# Patient Record
Sex: Male | Born: 1995 | Hispanic: Refuse to answer | Marital: Single | Smoking: Never smoker
Health system: Southern US, Community
[De-identification: ages and names within clinical notes are randomized; demographics above are authoritative.]

## PROBLEM LIST (undated history)

## (undated) DIAGNOSIS — J45909 Unspecified asthma, uncomplicated: Secondary | ICD-10-CM

## (undated) HISTORY — DX: Unspecified asthma, uncomplicated: J45.909

---

## 2017-04-08 ENCOUNTER — Ambulatory Visit: Admit: 2017-04-08 | Discharge: 2017-04-09 | Payer: PRIVATE HEALTH INSURANCE

## 2017-09-27 ENCOUNTER — Ambulatory Visit: Admit: 2017-09-27 | Discharge: 2017-09-28 | Payer: PRIVATE HEALTH INSURANCE

## 2017-10-17 ENCOUNTER — Ambulatory Visit: Admit: 2017-10-17 | Discharge: 2017-10-17 | Payer: PRIVATE HEALTH INSURANCE

## 2017-10-17 ENCOUNTER — Ambulatory Visit: Admit: 2017-10-17 | Discharge: 2017-10-18 | Payer: PRIVATE HEALTH INSURANCE

## 2017-10-17 DIAGNOSIS — M84375A Stress fracture, left foot, initial encounter for fracture: Secondary | ICD-10-CM

## 2017-10-17 DIAGNOSIS — M79672 Pain in left foot: Secondary | ICD-10-CM

## 2017-10-17 NOTE — Patient Instructions (Addendum)
You have a stress reaction of your second metatarsal    You should rest your foot and wear a boot. If you have pain, you should not push through the pain because it will just prolong the recovery.    You will be in the boot until your pain resolves, possibly 6-8 weeks.     Avoid taking anti-inflammatories like aleve, ibuprofen, and motrin because they can decrease bone healing    You should take a:  -multivitamin daily  -Vitamin D - extra 2000U daily  -Calcium - 1000mg  daily    The hardest thing for your bones is landing on your feet after jumping  As you come back, the hardest thing is avoiding over doing it  You will also need a shoe with a carbon fiber insert (to avoid bending in that area) or a stiff-soled shoe    We will write a prescription for a bone stimulator

## 2017-10-17 NOTE — Progress Notes (Signed)
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 Patient does recall a specific traumatic incident -- at the beginning of the season in 2017 - 05/2016 he started having midfoot pain and was diagnosed with a stress reaction of his 2nd metatarsal. Onset: 2.5 years, worse with exercise, especially cutting/pivoting exercises, and is relieved with rest. Pain quality: aching and sharp.  Previous treatments have included a cam boot, bone stimulator, physical therapy.    He was originally treated in a cam boot for 4 weeks and then was out the 2017-18 season redshirting. His pain returned in early 2018. He has not really rested since. He played through the pain this whole season. He has one more year left of eligibility to play basketball at [employer].     Pain is rated 3/10 (0=no pain, 10=worst imaginable pain).     For activities, the patient enjoys basketball.    PAST MEDICAL HISTORY:    Past Medical History:   Diagnosis Date    Asthma     Fractures          PAST SURGICAL HISTORY:    History reviewed. No pertinent surgical history.    ALLERGIES:    Patient has no known allergies.    MEDICATIONS:    No current outpatient medications on file.     No current facility-administered medications for this visit.        SOCIAL HISTORY:  Occupation:      Marital Status: Single   Tobacco Use: The patient  reports that he has never smoked. He has never used smokeless tobacco.     FAMILY HISTORY:    History reviewed. No pertinent family history.    REVIEW OF SYSTEMS:    ROS  Constitutional: Negative.    HENT: Negative.    Eyes: Negative.    Respiratory: Negative.    Cardiovascular: Negative.    Gastrointestinal: Negative.    Genitourinary: Negative.    Skin: Negative.     Neurological: Negative.    Endo/Heme/Allergies: Negative.    Psychiatric/Behavioral: Negative.      All other systems were reviewed and are negative.     Objective:      PHYSICAL EXAM:    Ht 195.6 cm (6\' 5" )   Wt 79.4 kg (175 lb)   BMI 20.75 kg/m     Physical Exam   Constitutional: Oriented to person, place, and time. Appears well-developed and well-nourished.   HENT:   Head: Normocephalic and atraumatic.   Eyes: Conjunctivae are normal.   Cardiovascular: Normal rate and regular rhythm.    Pulmonary/Chest: Effort normal.   Neurological: Alert and oriented to person, place, and time.   Skin: Skin is warm and dry.   Psychiatric: Normal mood and affect. Behavior is normal. Thought content normal.     ORTHOPAEDIC EXAM:    Right Ankle Exam     Tenderness   Right ankle tenderness location: over base of 2nd metatarsal.  Swelling: none    Range of Motion   The patient has normal right ankle ROM.    Muscle Strength   The patient has normal right ankle strength.    Other   Erythema: absent  Scars: absent  Sensation: normal  Pulse: present              IMAGING:  Based on the patients history and physical exam, radiographs were obtained.  I have personally reviewed and interpreted the images today.     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    Diagnosis, treatment options and long term prognosis were discussed.  Non-operative conservative measures as well as surgical intervention were both addressed.     The patient was offered the following conservative, non-operative measures: inserts/orthotics, ice, immobilization in a CAM boot walker and activity modification. They will consider their options and contact  us accordingly.     The patient was educated extensively about their condition, including the pathology, etiology, natural history and treatment options. The patient voiced understanding and all questions were answered.    Plan:  -Immobilization in cam boot  -rest, ice, elevation  -multivitamin, vitamin D and calcium supplements  -discussed bone stimulator-however pt previously had a bone stimulator   -referral placed to skeletal health          I personally reviewed and interpreted imaging with the patient.       The patient will follow-up: Return if symptoms worsen or fail to improve.    On arrival patient will need: 3V foot WB         Report electronically signed by:    Freddie Breech, MD  Foot [facility]le [facility], [place]      10/18/2017    ATTESTATION:  My date of service is 10/17/2017.  I was present for and performed key portions of an examination of the patient.  I am personally involved in the management of the patient. I agree with the findings and care plans as documented.    The above scribed documentation as annotated by me accurately reflects the services I have provided.    Freddie Breech, MD  10/18/2017 7:21 AM

## 2017-10-20 NOTE — Telephone Encounter (Signed)
[employer] [facility] calls to clarify a few things from patient's appointment.  They state pt has history of bilateral rigidity and they are concerned about him being immobilized for 8 weeks given he is an athlete. They would like to know what activities and modalities the patient is clear to do outside his boot such as a stationary bike and rowing machine.They have several alternative therapies such as Clash 4 laser therapy/light cure therapy and a VFR for restricted blood flow.     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 They would like to know exactly what the patient can do so that they can maintain his mobility and strength he has already gained.     The team physician, Dr. Vanita Ingles, is out of the country this week but can be reached next week for further questions at [phone] or [phone]

## 2017-10-25 NOTE — Telephone Encounter (Signed)
LVM for Steven Bullock explaining that per Dr. Constance Goltz patient can try therapies such as clash 4 laser/light cure therapy and VFR. He should only do them as tolerated and avoid any activities that cause pain or tenderness. Patient can also do activities such as swimming, jogging, weight lifting. Upper extremity bicycle, stationary bicycle alter G machine and rowing. Patient should avoid pivoting and jumping movements at first

## 2017-11-28 ENCOUNTER — Ambulatory Visit: Admit: 2017-11-28 | Discharge: 2017-11-29 | Payer: PRIVATE HEALTH INSURANCE

## 2017-11-28 ENCOUNTER — Ambulatory Visit: Admit: 2017-11-28 | Discharge: 2017-11-28 | Payer: PRIVATE HEALTH INSURANCE

## 2017-11-28 DIAGNOSIS — M79672 Pain in left foot: Secondary | ICD-10-CM

## 2017-11-28 DIAGNOSIS — M84375D Stress fracture, left foot, subsequent encounter for fracture with routine healing: Secondary | ICD-10-CM

## 2017-11-28 MED ORDER — CHOLECALCIFEROL (VITAMIN D3) 25 MCG (1,000 UNIT) CAPSULE
1000 | ORAL | Status: AC
Start: 2017-11-28 — End: ?

## 2017-11-28 MED ORDER — DIPHENHYDRAMINE 25 MG TABLET
25 | ORAL | Status: AC
Start: 2017-11-28 — End: 2018-11-05

## 2017-11-28 NOTE — Patient Instructions (Addendum)
Plan:  - You may gradually transition out of the CAM boot, guided by pain and tenderness  - If you plan on increased activity then plan on wearing the CAM boot  - Return to basketball gradually and in moderation with the assistance of physical therapy guided by pain and tenderness  - Take multivitamins, vitamin D supplements (2000 IU daily to aid bone healing), and calcium through food.  - Please use the anti-inflammatory oral medications or topical creams/gels (Voltaren).   - Transition back into stiff soled shoes with good arch support and cushioning  - Rest, ice, and elevation as needed  - Follow up in [place]- [place]

## 2017-11-28 NOTE — Progress Notes (Addendum)
Subjective:      I had the pleasure of seeing Steven Bullock in the [facility] today for evaluation of Follow-up (left foot )     HPI  Steven Bullock is a 22 y.o. 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     Activity: WB in CAM boot  Overall, patient is improving and continues to be immobilized in his CAM boot. However, patient states that he continues to experience "cracking" when WB  out of his boot such as when he is in the shower. He is currently using a bone stimulator.     Pain is rated 1/10 (0=no pain, 10=worst imaginable pain).     Patient does not complain of numbness of the ankle or foot.  Patient does not complain of instability of the foot/ ankle.  Patient is not taking narcotic pain medicine.  Patient is not taking anti-inflammatories.    Medication Taken: None    PAST MEDICAL HISTORY:    Past Medical History:   Diagnosis Date    Asthma     Fractures          PAST SURGICAL HISTORY:    No past surgical history on file.    ALLERGIES:    Patient has no known allergies.    MEDICATIONS:    Current Outpatient Medications   Medication Sig Dispense Refill    cholecalciferol, vitamin D3, 1,000 unit CAP Take 2,000 Units by mouth.      diphenhydrAMINE (BENADRYL) 25 mg tablet Take 25 mg by mouth.       No current facility-administered medications for this visit.        SOCIAL HISTORY:  Occupation:      Marital Status: Single   Tobacco Use: The patient  reports that he has never smoked. He has never used smokeless tobacco.     FAMILY HISTORY:    No family history on file.    REVIEW OF SYSTEMS:    ROS  Constitutional: Negative.    HENT: Negative.    Eyes: Negative.    Respiratory: Negative.    Cardiovascular: Negative.    Gastrointestinal: Negative.    Genitourinary: Negative.    Skin: Negative.    Neurological: Negative.    Endo/Heme/Allergies: Negative.    Psychiatric/Behavioral:  Negative.      All other systems were reviewed and are negative.     Objective:      PHYSICAL EXAM:    There were no vitals taken for this visit.    Physical Exam   Constitutional: Oriented to person, place, and time. Appears well-developed and well-nourished.   HENT:   Head: Normocephalic and atraumatic.   Eyes: Conjunctivae are normal.   Cardiovascular: Normal rate and regular rhythm.    Pulmonary/Chest: Effort normal.   Neurological: Alert and oriented to person, place, and time.   Skin: Skin is warm and dry.   Psychiatric: Normal mood and affect. Behavior is normal. Thought content normal.     ORTHOPAEDIC EXAM:    Right Ankle Exam     Tenderness   Right ankle tenderness location: over base of 2nd metatarsal.  Swelling: none    Range of Motion   The patient has normal right ankle ROM.    Muscle Strength   The patient has normal right ankle strength.    Other   Erythema: absent  Scars: absent  Sensation: normal  Pulse:  present              IMAGING:  Based on the patients history and physical exam, radiographs were obtained.  I have personally reviewed and interpreted the images today.     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     Activity: WB in CAM boot  Overall, patient is improving and continues to be immobilized in his CAM boot. However, patient states that he continues to experience "cracking" when WB  out of his boot such as when he is in the shower. He is currently using a bone stimulator.     Diagnosis, treatment options and long term prognosis were discussed.  Non-operative conservative measures as well as surgical intervention were both addressed.     Radiographs were reviewed with the patient today which shows no  evidence of new fractures or significant concerns.     He educated Steven Bullock that he has decided to transfer schools and is going back to [place] to spend the next year playing Basketball.  He is leaving in a few weeks. We told him that he can follow up with our colleagues in [place] if needed as well.    Patient was educated regarding diagnosis and treatment options.  Imaging was reviewed with the patient.  All questions were answered to verbal satisfaction.    Plan:  - You may gradually transition out of the CAM boot, guided by pain and tenderness  - If you plan on increased activity then plan on wearing the CAM boot  - Return to basketball gradually and in moderation with the assistance of physical therapy guided by pain and tenderness  - Take multivitamins, vitamin D supplements (2000 IU daily to aid bone healing), and calcium through food.  - Please use the anti-inflammatory oral medications or topical creams/gels (Voltaren).   - Transition back into stiff soled shoes with good arch support and cushioning  - Rest, ice, and elevation as needed  - Follow up in [place]     I personally reviewed and interpreted imaging with the patient.      30 minutes ([id number]) were spent with the patient, 25 minutes of which was spent providing education, guidance and counseling.     The patient will follow-up: Return if symptoms worsen or fail to improve, for f/u with Dr. Constance Goltz.  On arrival patient will need: 3V foot WB       Report electronically signed by:    Freddie Breech, MD  Foot & Ankle Othopaedic Surgery, [place]      I, Rosalie Gums am acting as a scribe for services provided by Freddie Breech, MD on 11/28/2017 3:53 PM  The above scribed documentation accurately reflects the services I have provided.    Freddie Breech, MD   11/29/2017 7:45 AM

## 2018-08-21 ENCOUNTER — Ambulatory Visit (INDEPENDENT_AMBULATORY_CARE_PROVIDER_SITE_OTHER): Payer: Self-pay | Admitting: Family Medicine

## 2018-08-21 ENCOUNTER — Encounter: Payer: Self-pay | Admitting: Family Medicine

## 2018-08-21 VITALS — BP 115/67 | HR 67 | Temp 98.2°F | Resp 14

## 2018-08-21 DIAGNOSIS — Z113 Encounter for screening for infections with a predominantly sexual mode of transmission: Secondary | ICD-10-CM

## 2018-08-21 MED ORDER — ALBUTEROL SULFATE HFA 108 (90 BASE) MCG/ACT IN AERS: 2.0000 | INHALATION_SPRAY | RESPIRATORY_TRACT | 2 refills | Status: DC | PRN

## 2018-08-21 NOTE — Progress Notes (Signed)
Patient presents today for STD screening.  Patient denies any symptoms at this time.  He denies any history of STDs in the past.  He admits that he is sexually active with women only.  He denies any genital rash, dysuria, joint pain, eye symptoms, penile discharge.  He denies any of his partners having symptoms.  He requests additional STD testing for HIV and RPR when asked.  Patient also requests refill on his albuterol inhaler.  He denies any problems currently with shortness of breath, chest pain, wheezing, cough.  Patient denies having unprotected sex.  ROS: Negative except mentioned above. Vitals as per Epic. GENERAL: NAD HEENT: no pharyngeal erythema, no exudate CARD: RRR RESP: CTA bilateral GU: deferred NEURO: CN II-XII grossly intact   A/P: STD screening - asymptomatic, will draw GC/Chlamydia (urine), HIV and RPR, seek medical attention if any symptoms, would refrain from sexual activity until results have been discussed, safe sex practices encouraged always.  Asthma - will refill patient's Albuterol Inhaler, encourage patient to keep it with him at all times.

## 2018-08-22 LAB — SPECIMEN STATUS

## 2018-08-23 LAB — CHLAMYDIA/GONOCOCCUS/TRICHOMONAS, NAA
Chlamydia by NAA: NEGATIVE
Gonococcus by NAA: NEGATIVE
Trich vag by NAA: NEGATIVE

## 2018-08-23 LAB — RPR: RPR Ser Ql: NONREACTIVE

## 2018-08-23 LAB — HIV ANTIBODY (ROUTINE TESTING W REFLEX): HIV Screen 4th Generation wRfx: NONREACTIVE

## 2019-03-26 ENCOUNTER — Other Ambulatory Visit: Payer: Self-pay

## 2019-03-26 DIAGNOSIS — Z20822 Contact with and (suspected) exposure to covid-19: Secondary | ICD-10-CM

## 2019-03-27 LAB — NOVEL CORONAVIRUS, NAA: SARS-CoV-2, NAA: NOT DETECTED

## 2019-08-28 DIAGNOSIS — R0789 Other chest pain: Secondary | ICD-10-CM | POA: Diagnosis present

## 2019-08-28 DIAGNOSIS — J45901 Unspecified asthma with (acute) exacerbation: Secondary | ICD-10-CM | POA: Diagnosis not present

## 2019-08-28 NOTE — ED Triage Notes (Signed)
FIRST NURSE NOTE: Pt reports he is out of his inhaler and has hx of asthma.

## 2019-08-29 ENCOUNTER — Other Ambulatory Visit: Payer: Self-pay

## 2019-08-29 ENCOUNTER — Emergency Department
Admission: EM | Admit: 2019-08-29 | Discharge: 2019-08-29 | Disposition: A | Discharge status: Home / Self Care | Payer: 59 | Attending: Emergency Medicine | Admitting: Emergency Medicine

## 2019-08-29 ENCOUNTER — Emergency Department: Payer: 59

## 2019-08-29 DIAGNOSIS — J45901 Unspecified asthma with (acute) exacerbation: Secondary | ICD-10-CM

## 2019-08-29 MED ORDER — ALBUTEROL SULFATE HFA 108 (90 BASE) MCG/ACT IN AERS: 2.0000 | INHALATION_SPRAY | RESPIRATORY_TRACT | 1 refills | Status: AC | PRN

## 2019-08-29 MED ORDER — IPRATROPIUM-ALBUTEROL 0.5-2.5 (3) MG/3ML IN SOLN
RESPIRATORY_TRACT | Status: AC
  Filled 2019-08-29: qty 3

## 2019-08-29 NOTE — ED Triage Notes (Signed)
Pt to the er for asthma flare up. Pt states it doesn't usually bother him and the last time it flared was a year ago. Pt denies any strenuous activity. Pt reports tightness to chest but denies pain. O2 sats WNL.

## 2019-08-29 NOTE — ED Provider Notes (Signed)
[facility] Emergency Department Provider Note   ____________________________________________    I have reviewed the triage vital signs and the nursing notes.   HISTORY  Chief Complaint Chest tightness    HPI Bryan Keith is a 24 y.o. male with a history of asthma who reports that he has had a tightness in his chest with mild shortness of breath which started today.  He does not have an asthma inhaler so he came to the emergency department.  Denies fevers or chills.  No cough.  No myalgias or fatigue.  Is in [employer] student  Past Medical History:  Diagnosis Date  . Asthma     There are no problems to display for this patient.   History reviewed. No pertinent surgical history.  Prior to Admission medications   Medication Sig Start Date End Date Taking? Authorizing Provider  albuterol (VENTOLIN HFA) 108 (90 Base) MCG/ACT inhaler Inhale 2 puffs into the lungs every 4 (four) hours as needed for wheezing or shortness of breath. 08/29/19   Lavonia Drafts, MD     Allergies Patient has no known allergies.  Family History  Problem Relation Age of Onset  . Cancer Father   . Cancer Maternal Grandfather     Social History Social History   Tobacco Use  . Smoking status: Never Smoker  . Smokeless tobacco: Never Used  Substance Use Topics  . Alcohol use: Yes  . Drug use: Not on file    Review of Systems  Constitutional: No fever/chills  ENT: No sore throat. Respiratory: As above CV: As above   Musculoskeletal: Negative for back pain. Skin: Negative for rash. Neurological: Negative for headaches     ____________________________________________   PHYSICAL EXAM:  VITAL SIGNS: ED Triage Vitals  Enc Vitals Group     BP 08/29/19 0009 140/88     Pulse Rate 08/29/19 0009 83     Resp 08/29/19 0009 20     Temp 08/29/19 0009 98.4 F (36.9 C)     Temp Source 08/29/19 0232 Oral     SpO2 08/29/19 0009 96 %     Weight 08/29/19 0007  79.4 kg (175 lb)     Height 08/29/19 0007 1.956 m (6\' 5" )     Head Circumference --      Peak Flow --      Pain Score 08/29/19 0010 4     Pain Loc --      Pain Edu? --      Excl. in [place]? --     Constitutional: Alert and oriented. No acute distress. Pleasant and interactive  Nose: No congestion/rhinnorhea. Mouth/Throat: Mucous membranes are moist.   Cardiovascular: Normal rate, regular rhythm.  Respiratory: Normal respiratory effort.  No retractions.,  Scattered very mild wheezes Genitourinary: deferred Musculoskeletal: No lower extremity tenderness nor edema.   Neurologic:  Normal speech and language. No gross focal neurologic deficits are appreciated.   Skin:  Skin is warm, dry and intact. No rash noted.   ____________________________________________   LABS (all labs ordered are listed, but only abnormal results are displayed)  Labs Reviewed - No data to display ____________________________________________  EKG   ____________________________________________  RADIOLOGY   ____________________________________________   PROCEDURES  Procedure(s) performed: No  Procedures   Critical Care performed: No ____________________________________________   INITIAL IMPRESSION / ASSESSMENT AND PLAN / ED COURSE  Pertinent labs & imaging results that were available during my care of the patient were reviewed by me and considered in my medical  decision making (see chart for details).  Patient well-appearing and in no acute distress, treated with DuoNeb with resolution of chest tightness and resolution of wheezes on exam.  Have prescribed albuterol inhaler, return precautions discussed   ____________________________________________   FINAL CLINICAL IMPRESSION(S) / ED DIAGNOSES  Final diagnoses:  Exacerbation of asthma, unspecified asthma severity, unspecified whether persistent      NEW MEDICATIONS STARTED DURING THIS VISIT:  Discharge Medication List as of  08/29/2019  2:24 AM       Note:  This document was prepared using Dragon voice recognition software and may include unintentional dictation errors.   Jene Every, MD 08/29/19 [phone]

## 2021-02-07 IMAGING — CR DG CHEST 2V
2 series · 2 of 2 positions shown · non-contrast
Comparison: None.

CLINICAL DATA: Asthma flare up.

EXAM:
CHEST - 2 VIEW

[chest pa]
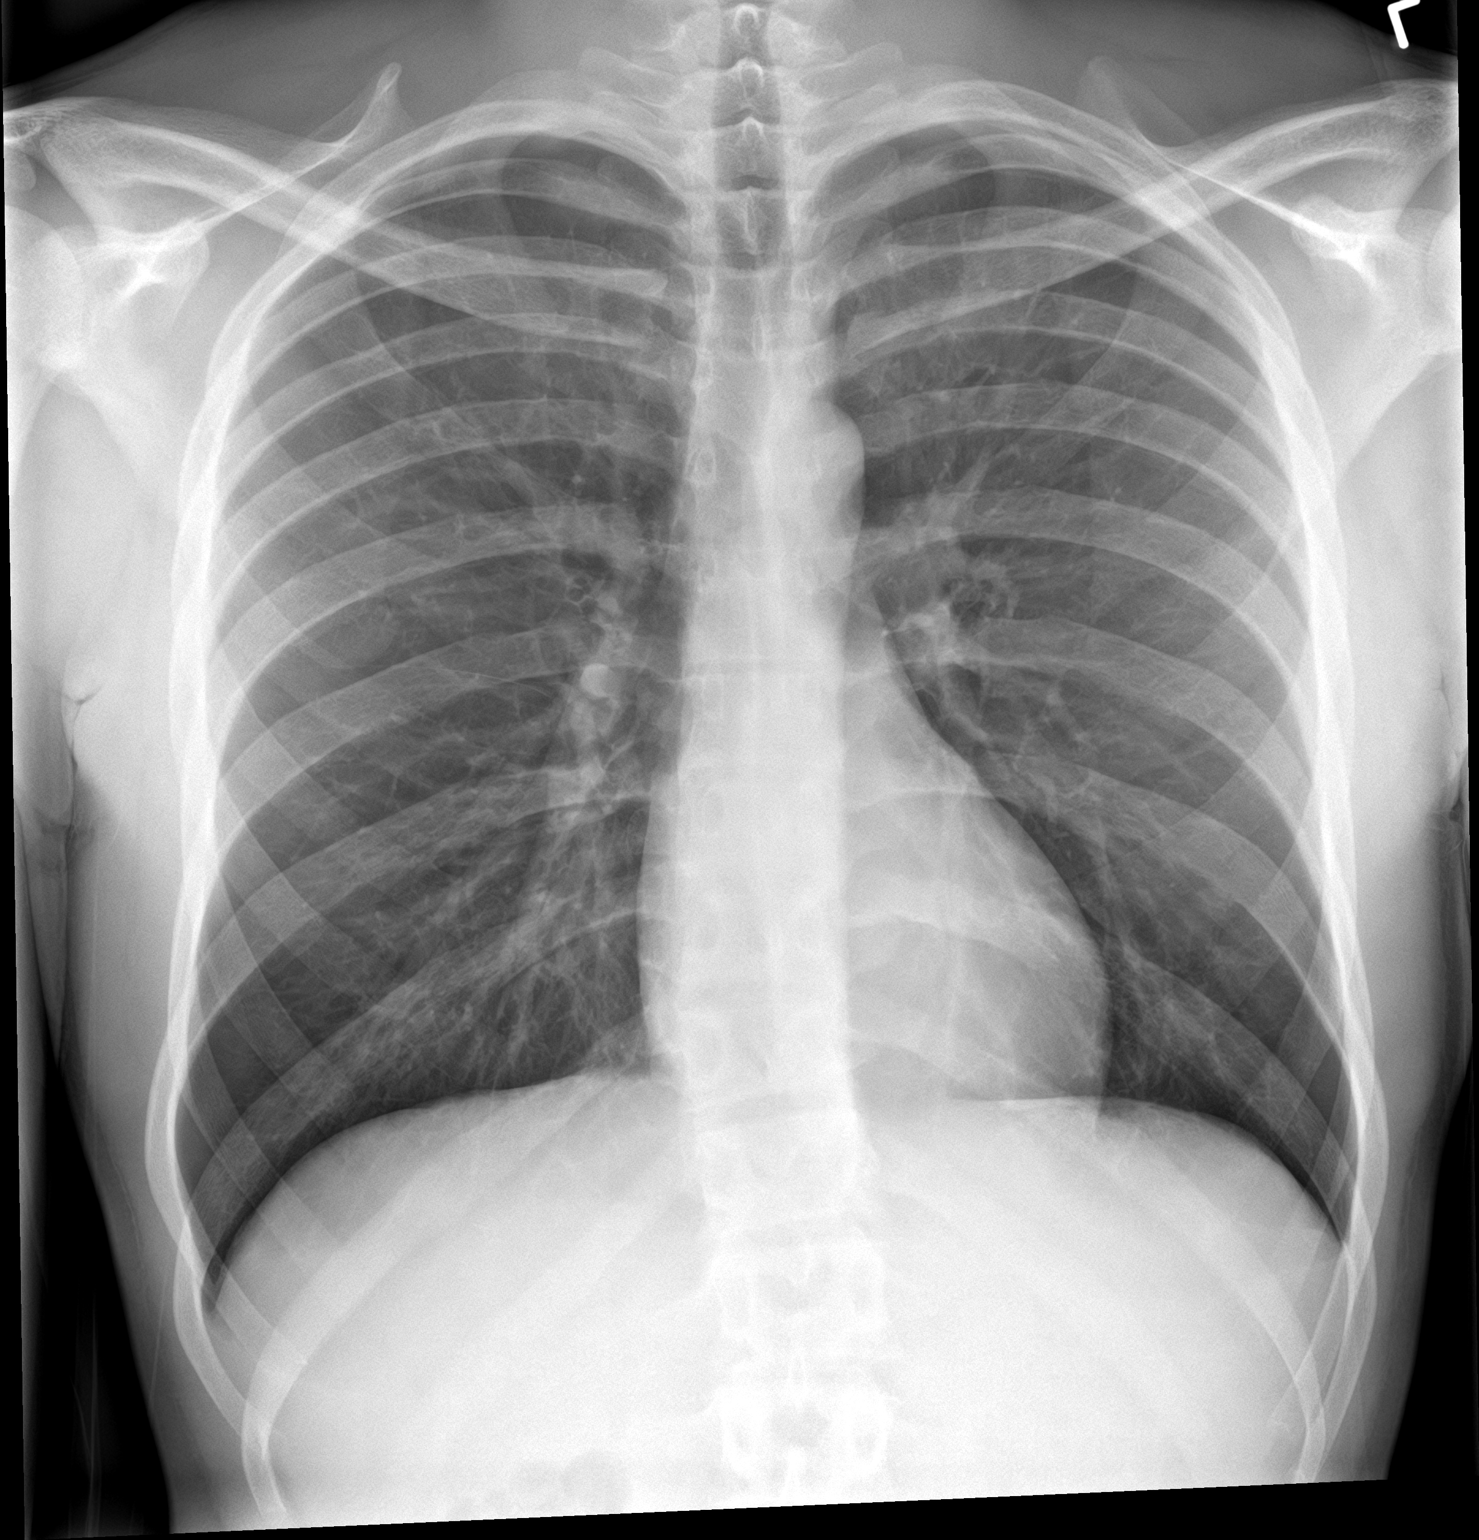

[chest lat]
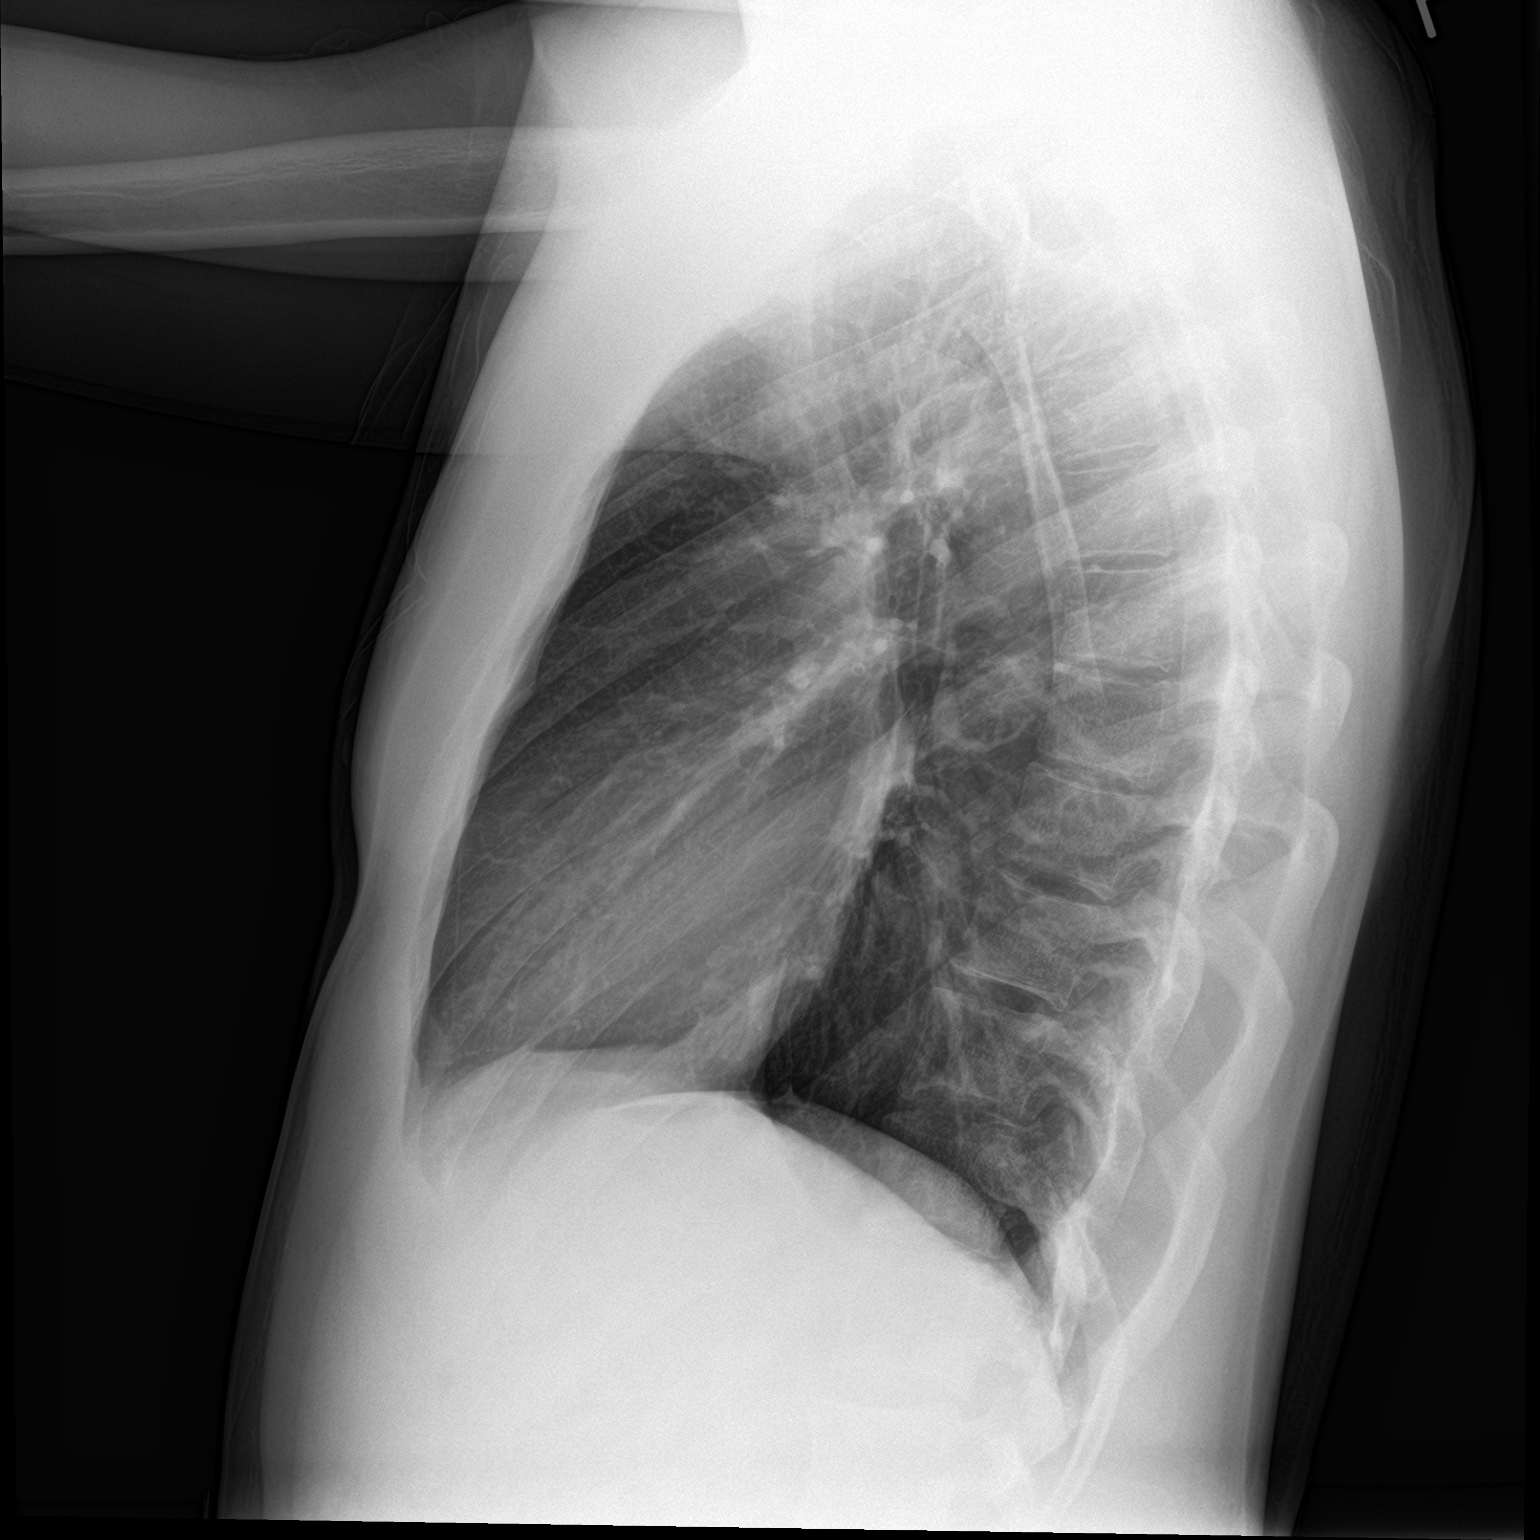

[2 of 2 positions shown; findings below may reference images not displayed]

FINDINGS: The heart size and mediastinal contours are within normal limits.
Both lungs are clear. The visualized skeletal structures are
unremarkable.
IMPRESSION: No active cardiopulmonary disease.
